# Patient Record
Sex: Male | Born: 2000
Health system: Southern US, Community
[De-identification: ages and names within clinical notes are randomized; demographics above are authoritative.]

## PROBLEM LIST (undated history)

## (undated) DIAGNOSIS — K611 Rectal abscess: Secondary | ICD-10-CM

## (undated) DIAGNOSIS — Z8489 Family history of other specified conditions: Secondary | ICD-10-CM

## (undated) HISTORY — PX: DENTAL RESTORATION/EXTRACTION WITH X-RAY: SHX5796

## (undated) HISTORY — PX: MOUTH SURGERY: SHX715

---
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
## 2021-01-15 ENCOUNTER — Ambulatory Visit: Payer: Self-pay | Admitting: General Surgery

## 2021-01-15 NOTE — H&P (View-Only) (Signed)
   The patient is a 20 year old male who presents for wound check. Hx of incision and drainage of perirectal abscess, debridement of 6 x 3 x 1 cm necrotic skin and subcutaneous tissue on 06/24/19 by Dr. Dwain Sarna. He was seen in the ER on 07/09/20 at which time a recurrent abscess had spontaneously started draining foul-smelling fluid. He was discharged with Cipro/Flagyl . He then developed recurrent perianal pain in the same location several weeks ago and was given a course of antibiotics for this. He is still having some pain in the area.   Problem List/Past Medical Romie Levee, MD; 01/15/2021 11:40 AM) WOUND CHECK, ABSCESS (Z51.89) ABSCESS, PERIRECTAL (K61.1)  Past Surgical History Romie Levee, MD; 01/15/2021 11:40 AM) Oral Surgery  Diagnostic Studies History Romie Levee, MD; 01/15/2021 11:40 AM) Colonoscopy never  Allergies Michel Bickers, LPN; 01/05/8118 14:78 AM) Penicillin V *PENICILLINS* No Known Drug Allergies [06/24/2019]: (Marked as Inactive) Allergies Reconciled  Medication History Michel Bickers, LPN; 2/95/6213 08:65 AM) No Current Medications Medications Reconciled  Social History Romie Levee, MD; 01/15/2021 11:40 AM) Caffeine use Carbonated beverages. No alcohol use No drug use  Family History Romie Levee, MD; 01/15/2021 11:40 AM) Diabetes Mellitus Mother. Hypertension Father.  Other Problems Romie Levee, MD; 01/15/2021 11:40 AM) No pertinent past medical history     Physical Exam Romie Levee MD; 01/15/2021 11:37 AM)  The physical exam findings are as follows: Note:GENERAL: Well-developed, well nourished male in no acute distress  EYES: No scleral icterus Pupils equal, lids normal  RESPIRATORY: Normal effort, no use of accessory muscles  MUSCULOSKELETAL: Normal gait Grossly normal ROM upper extremities Grossly normal ROM lower extremities  SKIN: Warm and dry Not diaphoretic  PSYCHIATRIC: Normal judgement and  insight Normal mood and affect Alert, oriented x 3  Rectal Note: Left anterior perianal region: Scar is present and widened with a moderate amount of swelling, but no overlying erythema/cellulitis Some possible fluctuance left lateral area No active drainage tender to palpation    Assessment & Plan Romie Levee MD; 01/15/2021 11:36 AM)  ABSCESS, PERIRECTAL (K61.1) Impression: 20 year old male with recurrent perirectal abscesses to the left anterior perineal region. On exam today, he appears to have a small area of fluctuance in the left lateral perianal region as well. This is tender to palpation. I do not think it needs drainage at this time. I have recommended an exam under anesthesia with fistulotomy versus seton. We have discussed this in detail including the possible risk of incontinence if too much of his sphincter muscle is transected. We discussed performing a seton. If this is the case, and proceeding with a second surgery several months down the road.

## 2021-01-15 NOTE — H&P (Signed)
   The patient is a 19 year old male who presents for wound check. Hx of incision and drainage of perirectal abscess, debridement of 6 x 3 x 1 cm necrotic skin and subcutaneous tissue on 06/24/19 by Dr. Wakefield. He was seen in the ER on 07/09/20 at which time a recurrent abscess had spontaneously started draining foul-smelling fluid. He was discharged with Cipro/Flagyl . He then developed recurrent perianal pain in the same location several weeks ago and was given a course of antibiotics for this. He is still having some pain in the area.   Problem List/Past Medical (Bita Cartwright, MD; 01/15/2021 11:40 AM) WOUND CHECK, ABSCESS (Z51.89) ABSCESS, PERIRECTAL (K61.1)  Past Surgical History (Jazlin Tapscott, MD; 01/15/2021 11:40 AM) Oral Surgery  Diagnostic Studies History (Jaimie Redditt, MD; 01/15/2021 11:40 AM) Colonoscopy never  Allergies (Kelly Dockery, LPN; 01/15/2021 11:27 AM) Penicillin V *PENICILLINS* No Known Drug Allergies [06/24/2019]: (Marked as Inactive) Allergies Reconciled  Medication History (Kelly Dockery, LPN; 01/15/2021 11:27 AM) No Current Medications Medications Reconciled  Social History (Lennon Boutwell, MD; 01/15/2021 11:40 AM) Caffeine use Carbonated beverages. No alcohol use No drug use  Family History (Izola Teague, MD; 01/15/2021 11:40 AM) Diabetes Mellitus Mother. Hypertension Father.  Other Problems (Shanique Aslinger, MD; 01/15/2021 11:40 AM) No pertinent past medical history     Physical Exam (Paulena Servais MD; 01/15/2021 11:37 AM)  The physical exam findings are as follows: Note:GENERAL: Well-developed, well nourished male in no acute distress  EYES: No scleral icterus Pupils equal, lids normal  RESPIRATORY: Normal effort, no use of accessory muscles  MUSCULOSKELETAL: Normal gait Grossly normal ROM upper extremities Grossly normal ROM lower extremities  SKIN: Warm and dry Not diaphoretic  PSYCHIATRIC: Normal judgement and  insight Normal mood and affect Alert, oriented x 3  Rectal Note: Left anterior perianal region: Scar is present and widened with a moderate amount of swelling, but no overlying erythema/cellulitis Some possible fluctuance left lateral area No active drainage tender to palpation    Assessment & Plan (Lira Stephen MD; 01/15/2021 11:36 AM)  ABSCESS, PERIRECTAL (K61.1) Impression: 19-year-old male with recurrent perirectal abscesses to the left anterior perineal region. On exam today, he appears to have a small area of fluctuance in the left lateral perianal region as well. This is tender to palpation. I do not think it needs drainage at this time. I have recommended an exam under anesthesia with fistulotomy versus seton. We have discussed this in detail including the possible risk of incontinence if too much of his sphincter muscle is transected. We discussed performing a seton. If this is the case, and proceeding with a second surgery several months down the road. 

## 2021-01-22 ENCOUNTER — Encounter (HOSPITAL_BASED_OUTPATIENT_CLINIC_OR_DEPARTMENT_OTHER): Payer: Self-pay | Admitting: General Surgery

## 2021-01-22 ENCOUNTER — Other Ambulatory Visit (HOSPITAL_COMMUNITY)
Admission: RE | Admit: 2021-01-22 | Discharge: 2021-01-22 | Disposition: A | Discharge status: Home / Self Care | Payer: Medicaid Other | Source: Ambulatory Visit | Attending: General Surgery | Admitting: General Surgery

## 2021-01-22 DIAGNOSIS — Z20822 Contact with and (suspected) exposure to covid-19: Secondary | ICD-10-CM | POA: Insufficient documentation

## 2021-01-22 DIAGNOSIS — Z01812 Encounter for preprocedural laboratory examination: Secondary | ICD-10-CM | POA: Insufficient documentation

## 2021-01-22 LAB — SARS CORONAVIRUS 2 (TAT 6-24 HRS): SARS Coronavirus 2: NEGATIVE

## 2021-01-23 ENCOUNTER — Other Ambulatory Visit: Payer: Self-pay

## 2021-01-23 ENCOUNTER — Encounter (HOSPITAL_BASED_OUTPATIENT_CLINIC_OR_DEPARTMENT_OTHER): Payer: Self-pay | Admitting: General Surgery

## 2021-01-23 NOTE — Progress Notes (Signed)
Spoke w/ via phone for pre-op interview---  Pt's mother, Thalia Party Lab needs dos---- no              Lab results------ no COVID test ------ done 01-22-2021 negative result in epic Arrive at ------- 0745 on 01-25-2021 NPO after MN NO Solid Food.  Clear liquids from MN until--- 0645 Med rec completed Medications to take morning of surgery ----- NONE Diabetic medication ----- n/a Patient instructed to bring photo id and insurance card day of surgery Patient aware to have Driver (ride ) / caregiver    for 24 hours after surgery --- mother Patient Special Instructions ----- n/a Pre-Op special Istructions ----- n/a Patient verbalized understanding of instructions that were given at this phone interview. Patient denies shortness of breath, chest pain, fever, cough at this phone interview.

## 2021-01-25 ENCOUNTER — Ambulatory Visit (HOSPITAL_BASED_OUTPATIENT_CLINIC_OR_DEPARTMENT_OTHER): Payer: Medicaid Other | Admitting: Anesthesiology

## 2021-01-25 ENCOUNTER — Encounter (HOSPITAL_BASED_OUTPATIENT_CLINIC_OR_DEPARTMENT_OTHER): Payer: Self-pay | Admitting: General Surgery

## 2021-01-25 ENCOUNTER — Ambulatory Visit (HOSPITAL_BASED_OUTPATIENT_CLINIC_OR_DEPARTMENT_OTHER)
Admission: RE | Admit: 2021-01-25 | Discharge: 2021-01-25 | Disposition: A | Discharge status: Home / Self Care | Payer: Medicaid Other | Source: Ambulatory Visit | Attending: General Surgery | Admitting: General Surgery

## 2021-01-25 ENCOUNTER — Encounter (HOSPITAL_BASED_OUTPATIENT_CLINIC_OR_DEPARTMENT_OTHER)
Admission: RE | Disposition: A | Discharge status: Home / Self Care | Payer: Self-pay | Source: Ambulatory Visit | Attending: General Surgery

## 2021-01-25 DIAGNOSIS — K611 Rectal abscess: Secondary | ICD-10-CM | POA: Diagnosis present

## 2021-01-25 DIAGNOSIS — Z880 Allergy status to penicillin: Secondary | ICD-10-CM | POA: Diagnosis not present

## 2021-01-25 DIAGNOSIS — K603 Anal fistula: Secondary | ICD-10-CM | POA: Diagnosis not present

## 2021-01-25 HISTORY — PX: RECTAL EXAM UNDER ANESTHESIA: SHX6399

## 2021-01-25 HISTORY — DX: Rectal abscess: K61.1

## 2021-01-25 HISTORY — PX: ANAL FISTULOTOMY: SHX6423

## 2021-01-25 SURGERY — ANAL FISTULOTOMY
Anesthesia: Monitor Anesthesia Care

## 2021-01-25 MED ORDER — ACETAMINOPHEN 160 MG/5ML PO SOLN: 325.0000 mg | ORAL | Status: DC | PRN

## 2021-01-25 MED ORDER — BUPIVACAINE-EPINEPHRINE 0.5% -1:200000 IJ SOLN
INTRAMUSCULAR | Status: DC | PRN
  Administered 2021-01-25: 50 mL

## 2021-01-25 MED ORDER — PROPOFOL 500 MG/50ML IV EMUL
INTRAVENOUS | Status: AC
  Filled 2021-01-25: qty 50

## 2021-01-25 MED ORDER — LACTATED RINGERS IV SOLN: INTRAVENOUS | Status: DC

## 2021-01-25 MED ORDER — ONDANSETRON HCL 4 MG/2ML IJ SOLN
INTRAMUSCULAR | Status: DC | PRN
  Administered 2021-01-25: 4 mg via INTRAVENOUS

## 2021-01-25 MED ORDER — FENTANYL CITRATE (PF) 100 MCG/2ML IJ SOLN
25.0000 ug | INTRAMUSCULAR | Status: DC | PRN
  Administered 2021-01-25: 25 ug via INTRAVENOUS

## 2021-01-25 MED ORDER — ONDANSETRON HCL 4 MG/2ML IJ SOLN
INTRAMUSCULAR | Status: AC
  Filled 2021-01-25: qty 2

## 2021-01-25 MED ORDER — ACETAMINOPHEN 10 MG/ML IV SOLN: 1000.0000 mg | Freq: Once | INTRAVENOUS | Status: DC | PRN

## 2021-01-25 MED ORDER — OXYCODONE HCL 5 MG PO TABS: 5.0000 mg | ORAL_TABLET | Freq: Four times a day (QID) | ORAL | 0 refills | Status: DC | PRN

## 2021-01-25 MED ORDER — FENTANYL CITRATE (PF) 100 MCG/2ML IJ SOLN
INTRAMUSCULAR | Status: DC | PRN
  Administered 2021-01-25: 25 ug via INTRAVENOUS
  Administered 2021-01-25: 50 ug via INTRAVENOUS

## 2021-01-25 MED ORDER — AMISULPRIDE (ANTIEMETIC) 5 MG/2ML IV SOLN: 10.0000 mg | Freq: Once | INTRAVENOUS | Status: DC | PRN

## 2021-01-25 MED ORDER — ACETAMINOPHEN 500 MG PO TABS
ORAL_TABLET | ORAL | Status: AC
  Filled 2021-01-25: qty 2

## 2021-01-25 MED ORDER — KETAMINE HCL 50 MG/5ML IJ SOSY
PREFILLED_SYRINGE | INTRAMUSCULAR | Status: AC
  Filled 2021-01-25: qty 5

## 2021-01-25 MED ORDER — OXYCODONE HCL 5 MG/5ML PO SOLN: 5.0000 mg | Freq: Once | ORAL | Status: DC | PRN

## 2021-01-25 MED ORDER — PROPOFOL 500 MG/50ML IV EMUL
INTRAVENOUS | Status: DC | PRN
  Administered 2021-01-25: 200 ug/kg/min via INTRAVENOUS

## 2021-01-25 MED ORDER — PROMETHAZINE HCL 25 MG/ML IJ SOLN: 6.2500 mg | INTRAMUSCULAR | Status: DC | PRN

## 2021-01-25 MED ORDER — MIDAZOLAM HCL 5 MG/5ML IJ SOLN
INTRAMUSCULAR | Status: DC | PRN
  Administered 2021-01-25: 2 mg via INTRAVENOUS

## 2021-01-25 MED ORDER — DEXMEDETOMIDINE (PRECEDEX) IN NS 20 MCG/5ML (4 MCG/ML) IV SYRINGE
PREFILLED_SYRINGE | INTRAVENOUS | Status: AC
  Filled 2021-01-25: qty 5

## 2021-01-25 MED ORDER — FENTANYL CITRATE (PF) 100 MCG/2ML IJ SOLN
INTRAMUSCULAR | Status: AC
  Filled 2021-01-25: qty 2

## 2021-01-25 MED ORDER — KETAMINE HCL 10 MG/ML IJ SOLN
INTRAMUSCULAR | Status: DC | PRN
  Administered 2021-01-25: 20 mg via INTRAVENOUS

## 2021-01-25 MED ORDER — GLYCOPYRROLATE PF 0.2 MG/ML IJ SOSY
PREFILLED_SYRINGE | INTRAMUSCULAR | Status: AC
  Filled 2021-01-25: qty 1

## 2021-01-25 MED ORDER — DEXMEDETOMIDINE (PRECEDEX) IN NS 20 MCG/5ML (4 MCG/ML) IV SYRINGE
PREFILLED_SYRINGE | INTRAVENOUS | Status: DC | PRN
  Administered 2021-01-25 (×2): 8 ug via INTRAVENOUS
  Administered 2021-01-25: 4 ug via INTRAVENOUS

## 2021-01-25 MED ORDER — ACETAMINOPHEN 500 MG PO TABS
1000.0000 mg | ORAL_TABLET | ORAL | Status: AC
  Administered 2021-01-25: 1000 mg via ORAL

## 2021-01-25 MED ORDER — LIDOCAINE 5 % EX OINT
TOPICAL_OINTMENT | CUTANEOUS | Status: DC | PRN
  Administered 2021-01-25: 1

## 2021-01-25 MED ORDER — 0.9 % SODIUM CHLORIDE (POUR BTL) OPTIME
TOPICAL | Status: DC | PRN
  Administered 2021-01-25: 1000 mL

## 2021-01-25 MED ORDER — SODIUM CHLORIDE 0.9% FLUSH: 3.0000 mL | Freq: Two times a day (BID) | INTRAVENOUS | Status: DC

## 2021-01-25 MED ORDER — GLYCOPYRROLATE PF 0.2 MG/ML IJ SOSY
PREFILLED_SYRINGE | INTRAMUSCULAR | Status: DC | PRN
  Administered 2021-01-25: .1 mg via INTRAVENOUS

## 2021-01-25 MED ORDER — OXYCODONE HCL 5 MG PO TABS: 5.0000 mg | ORAL_TABLET | Freq: Once | ORAL | Status: DC | PRN

## 2021-01-25 MED ORDER — MIDAZOLAM HCL 2 MG/2ML IJ SOLN
INTRAMUSCULAR | Status: AC
  Filled 2021-01-25: qty 2

## 2021-01-25 MED ORDER — PROPOFOL 10 MG/ML IV BOLUS
INTRAVENOUS | Status: DC | PRN
  Administered 2021-01-25: 30 mg via INTRAVENOUS

## 2021-01-25 MED ORDER — ACETAMINOPHEN 325 MG PO TABS: 325.0000 mg | ORAL_TABLET | ORAL | Status: DC | PRN

## 2021-01-25 SURGICAL SUPPLY — 55 items
APL SKNCLS STERI-STRIP NONHPOA (GAUZE/BANDAGES/DRESSINGS) ×2
BENZOIN TINCTURE PRP APPL 2/3 (GAUZE/BANDAGES/DRESSINGS) ×4 IMPLANT
BLADE EXTENDED COATED 6.5IN (ELECTRODE) IMPLANT
BLADE HEX COATED 2.75 (ELECTRODE) ×2 IMPLANT
BLADE SURG 10 STRL SS (BLADE) ×2 IMPLANT
BRIEF STRETCH FOR OB PAD LRG (UNDERPADS AND DIAPERS) ×2 IMPLANT
CANISTER SUCT 3000ML PPV (MISCELLANEOUS) ×2 IMPLANT
COVER BACK TABLE 60X90IN (DRAPES) ×2 IMPLANT
COVER MAYO STAND STRL (DRAPES) ×2 IMPLANT
COVER WAND RF STERILE (DRAPES) ×2 IMPLANT
DECANTER SPIKE VIAL GLASS SM (MISCELLANEOUS) ×2 IMPLANT
DRAPE HYSTEROSCOPY (MISCELLANEOUS) IMPLANT
DRAPE LAPAROTOMY 100X72 PEDS (DRAPES) ×2 IMPLANT
DRAPE SHEET LG 3/4 BI-LAMINATE (DRAPES) IMPLANT
DRAPE UTILITY XL STRL (DRAPES) ×2 IMPLANT
DRSG PAD ABDOMINAL 8X10 ST (GAUZE/BANDAGES/DRESSINGS) ×2 IMPLANT
ELECT REM PT RETURN 9FT ADLT (ELECTROSURGICAL) ×2
ELECTRODE REM PT RTRN 9FT ADLT (ELECTROSURGICAL) ×1 IMPLANT
GAUZE SPONGE 4X4 12PLY STRL (GAUZE/BANDAGES/DRESSINGS) ×2 IMPLANT
GLOVE SURG ENC MOIS LTX SZ6.5 (GLOVE) ×2 IMPLANT
GLOVE SURG UNDER POLY LF SZ7 (GLOVE) ×2 IMPLANT
GOWN STRL REUS W/TWL XL LVL3 (GOWN DISPOSABLE) ×2 IMPLANT
HYDROGEN PEROXIDE 16OZ (MISCELLANEOUS) ×2 IMPLANT
IV CATH 14GX2 1/4 (CATHETERS) ×2 IMPLANT
IV CATH 18G SAFETY (IV SOLUTION) ×2 IMPLANT
KIT SIGMOIDOSCOPE (SET/KITS/TRAYS/PACK) IMPLANT
KIT TURNOVER CYSTO (KITS) ×2 IMPLANT
LEGGING LITHOTOMY PAIR STRL (DRAPES) IMPLANT
LOOP VESSEL MAXI BLUE (MISCELLANEOUS) IMPLANT
NEEDLE HYPO 22GX1.5 SAFETY (NEEDLE) ×2 IMPLANT
NS IRRIG 500ML POUR BTL (IV SOLUTION) ×2 IMPLANT
PACK BASIN DAY SURGERY FS (CUSTOM PROCEDURE TRAY) ×2 IMPLANT
PAD ARMBOARD 7.5X6 YLW CONV (MISCELLANEOUS) ×2 IMPLANT
PAD PREP 24X48 CUFFED NSTRL (MISCELLANEOUS) IMPLANT
PENCIL SMOKE EVACUATOR (MISCELLANEOUS) ×2 IMPLANT
SPONGE HEMORRHOID 8X3CM (HEMOSTASIS) IMPLANT
SPONGE SURGIFOAM ABS GEL 100 (HEMOSTASIS) IMPLANT
SPONGE SURGIFOAM ABS GEL 12-7 (HEMOSTASIS) IMPLANT
SUCTION FRAZIER HANDLE 10FR (MISCELLANEOUS)
SUCTION TUBE FRAZIER 10FR DISP (MISCELLANEOUS) IMPLANT
SUT CHROMIC 2 0 SH (SUTURE) ×2 IMPLANT
SUT CHROMIC 3 0 SH 27 (SUTURE) IMPLANT
SUT ETHIBOND 0 (SUTURE) IMPLANT
SUT VIC AB 2-0 SH 27 (SUTURE)
SUT VIC AB 2-0 SH 27XBRD (SUTURE) IMPLANT
SUT VIC AB 3-0 SH 18 (SUTURE) ×2 IMPLANT
SUT VIC AB 3-0 SH 27 (SUTURE)
SUT VIC AB 3-0 SH 27XBRD (SUTURE) IMPLANT
SYR 10ML LL (SYRINGE) IMPLANT
SYR CONTROL 10ML LL (SYRINGE) ×2 IMPLANT
TOWEL OR 17X26 10 PK STRL BLUE (TOWEL DISPOSABLE) ×2 IMPLANT
TRAY DSU PREP LF (CUSTOM PROCEDURE TRAY) ×2 IMPLANT
TUBE CONNECTING 12X1/4 (SUCTIONS) ×2 IMPLANT
UNDERPAD 30X36 HEAVY ABSORB (UNDERPADS AND DIAPERS) ×2 IMPLANT
YANKAUER SUCT BULB TIP NO VENT (SUCTIONS) ×2 IMPLANT

## 2021-01-25 NOTE — Discharge Instructions (Addendum)
ANORECTAL SURGERY: POST OP INSTRUCTIONS 1. Take your usually prescribed home medications unless otherwise directed. 2. DIET: During the first few hours after surgery sip on some liquids until you are able to urinate.  It is normal to not urinate for several hours after this surgery.  If you feel uncomfortable, please contact the office for instructions.  After you are able to urinate,you may eat, if you feel like it.  Follow a light bland diet the first 24 hours after arrival home, such as soup, liquids, crackers, etc.  Be sure to include lots of fluids daily (6-8 glasses).  Avoid fast food or heavy meals, as your are more likely to get nauseated.  Eat a low fat diet the next few days after surgery.  Limit caffeine intake to 1-2 servings a day. 3. PAIN CONTROL: a. Pain is best controlled by a usual combination of several different methods TOGETHER: i. Muscle relaxation: Soak in a warm bath (or Sitz bath) three times a day and after bowel movements.  Continue to do this until all pain is resolved. ii. Over the counter pain medication iii. Prescription pain medication b. Most patients will experience some swelling and discomfort in the anus/rectal area and incisions.  Heat such as warm towels, sitz baths, warm baths, etc to help relax tight/sore spots and speed recovery.  Some people prefer to use ice, especially in the first couple days after surgery, as it may decrease the pain and swelling, or alternate between ice & heat.  Experiment to what works for you.  Swelling and bruising can take several weeks to resolve.  Pain can take even longer to completely resolve. c. It is helpful to take an over-the-counter pain medication regularly for the first few weeks.  Choose one of the following that works best for you: i. Naproxen (Aleve, etc)  Two 220mg tabs twice a day ii. Ibuprofen (Advil, etc) Three 200mg tabs four times a day (every meal & bedtime) d. A  prescription for pain medication (such as percocet,  oxycodone, hydrocodone, etc) should be given to you upon discharge.  Take your pain medication as prescribed.  i. If you are having problems/concerns with the prescription medicine (does not control pain, nausea, vomiting, rash, itching, etc), please call us [phone] to see if we need to switch you to a different pain medicine that will work better for you and/or control your side effect better. ii. If you need a refill on your pain medication, please contact your pharmacy.  They will contact our office to request authorization. Prescriptions will not be filled after 5 pm or on week-ends. 4. KEEP YOUR BOWELS REGULAR and AVOID CONSTIPATION a. The goal is one to two soft bowel movements a day.  You should at least have a bowel movement every other day. b. Avoid getting constipated.  Between the surgery and the pain medications, it is common to experience some constipation. This can be very painful after rectal surgery.  Increasing fluid intake and taking a fiber supplement (such as Metamucil, Citrucel, FiberCon, etc) 1-2 times a day regularly will usually help prevent this problem from occurring.  A stool softener like colace is also recommended.  This can be purchased over the counter at your pharmacy.  You can take it up to 3 times a day.  If you do not have a bowel movement after 24 hrs since your surgery, take one does of milk of magnesia.  If you still haven't had a bowel movement 8-12 hours after   that dose, take another dose.  If you don't have a bowel movement 48 hrs after surgery, purchase a Fleets enema from the drug store and administer gently per package instructions.  If you still are having trouble with your bowel movements after that, please call the office for further instructions. c. If you develop diarrhea or have many loose bowel movements, simplify your diet to bland foods & liquids for a few days.  Stop any stool softeners and decrease your fiber supplement.  Switching to mild  anti-diarrheal medications (Kayopectate, Pepto Bismol) can help.  If this worsens or does not improve, please call us.  5. Wound Care a. Remove your bandages before your first bowel movement or 8 hours after surgery.     b. Remove any wound packing material at this tim,e as well.  You do not need to repack the wound unless instructed otherwise.  Wear an absorbent pad or soft cotton gauze in your underwear to catch any drainage and help keep the area clean. You should change this every 2-3 hours while awake. c. Keep the area clean and dry.  Bathe / shower every day, especially after bowel movements.  Keep the area clean by showering / bathing over the incision / wound.   It is okay to soak an open wound to help wash it.  Wet wipes or showers / gentle washing after bowel movements is often less traumatic than regular toilet paper. d. You may have some styrofoam-like soft packing in the rectum which will come out with the first bowel movement.  e. You will often notice bleeding with bowel movements.  This should slow down by the end of the first week of surgery f. Expect some drainage.  This should slow down, too, by the end of the first week of surgery.  Wear an absorbent pad or soft cotton gauze in your underwear until the drainage stops. g. Do Not sit on a rubber or pillow ring.  This can make you symptoms worse.  You may sit on a soft pillow if needed.  6. ACTIVITIES as tolerated:   a. You may resume regular (light) daily activities beginning the next day--such as daily self-care, walking, climbing stairs--gradually increasing activities as tolerated.  If you can walk 30 minutes without difficulty, it is safe to try more intense activity such as jogging, treadmill, bicycling, low-impact aerobics, swimming, etc. b. Save the most intensive and strenuous activity for last such as sit-ups, heavy lifting, contact sports, etc  Refrain from any heavy lifting or straining until you are off narcotics for pain  control.   c. You may drive when you are no longer taking prescription pain medication, you can comfortably sit for long periods of time, and you can safely maneuver your car and apply brakes. d. You may have sexual intercourse when it is comfortable.  7. FOLLOW UP in our office a. Please call [facility] at [phone] to set up an appointment to see your surgeon in the office for a follow-up appointment approximately 3-4 weeks after your surgery. b. Make sure that you call for this appointment the day you arrive home to insure a convenient appointment time. 10. IF YOU HAVE DISABILITY OR FAMILY LEAVE FORMS, BRING THEM TO THE OFFICE FOR PROCESSING.  DO NOT GIVE THEM TO YOUR DOCTOR.     WHEN TO CALL US [phone]: 1. Poor pain control 2. Reactions / problems with new medications (rash/itching, nausea, etc)  3. Fever over 101.5 F (38.5 C) 4.   Inability to urinate 5. Nausea and/or vomiting 6. Worsening swelling or bruising 7. Continued bleeding from incision. 8. Increased pain, redness, or drainage from the incision  The clinic staff is available to answer your questions during regular business hours (8:30am-5pm).  Please don't hesitate to call and ask to speak to one of our nurses for clinical concerns.   A surgeon from [facility] is always on call at the hospitals   If you have a medical emergency, go to the nearest emergency room or call 911.    [facility] [address], [address], [place], [place]  [address] ? MAIN: (336) [phone] ? TOLL FREE: [phone] ? FAX [phone] [email or web address]   Post Anesthesia Home Care Instructions  Activity: Get plenty of rest for the remainder of the day. A responsible individual must stay with you for 24 hours following the procedure.  For the next 24 hours, DO NOT: -Drive a car -Advertising copywriter -Drink alcoholic beverages -Take any medication unless instructed by your physician -Make  any legal decisions or sign important papers.  Meals: Start with liquid foods such as gelatin or soup. Progress to regular foods as tolerated. Avoid greasy, spicy, heavy foods. If nausea and/or vomiting occur, drink only clear liquids until the nausea and/or vomiting subsides. Call your physician if vomiting continues.  Special Instructions/Symptoms: Your throat may feel dry or sore from the anesthesia or the breathing tube placed in your throat during surgery. If this causes discomfort, gargle with warm salt water. The discomfort should disappear within 24 hours.  No Tylenol/acetaminophen until after 2:00 pm today if needed.

## 2021-01-25 NOTE — Transfer of Care (Signed)
Immediate Anesthesia Transfer of Care Note  Patient: Shawn Holmes  Procedure(s) Performed: FISTULOTOMY (N/A ) EXAM UNDER ANESTHESIA (N/A )  Patient Location: PACU  Anesthesia Type:MAC  Level of Consciousness: drowsy, patient cooperative and responds to stimulation  Airway & Oxygen Therapy: Patient Spontanous Breathing and Patient connected to face mask oxygen  Post-op Assessment: Report given to RN and Post -op Vital signs reviewed and stable  Post vital signs: Reviewed and stable  Last Vitals:  Vitals Value Taken Time  BP 117/59 01/25/21 0935  Temp    Pulse    Resp 0 01/25/21 0937  SpO2    Vitals shown include unvalidated device data.  Last Pain:  Vitals:   01/25/21 0805  TempSrc: Oral  PainSc: 0-No pain      Patients Stated Pain Goal: 5 (58/25/18 9842)  Complications: No complications documented.

## 2021-01-25 NOTE — Anesthesia Preprocedure Evaluation (Addendum)
Anesthesia Evaluation  Patient identified by MRN, date of birth, ID band Patient awake    Reviewed: Allergy & Precautions, NPO status , Patient's Chart, lab work & pertinent test results  Airway Mallampati: III  TM Distance: >3 FB Neck ROM: Full    Dental  (+) Teeth Intact, Dental Advisory Given   Pulmonary neg pulmonary ROS,    breath sounds clear to auscultation       Cardiovascular negative cardio ROS   Rhythm:Regular Rate:Normal     Neuro/Psych negative neurological ROS  negative psych ROS   GI/Hepatic negative GI ROS, Neg liver ROS,   Endo/Other  negative endocrine ROS  Renal/GU negative Renal ROS     Musculoskeletal negative musculoskeletal ROS (+)   Abdominal Normal abdominal exam  (+)   Peds  Hematology negative hematology ROS (+)   Anesthesia Other Findings   Reproductive/Obstetrics                            Anesthesia Physical Anesthesia Plan  ASA: I  Anesthesia Plan: MAC   Post-op Pain Management:    Induction: Intravenous  PONV Risk Score and Plan: 2 and Ondansetron, Propofol infusion and Midazolam  Airway Management Planned: Simple Face Mask and Natural Airway  Additional Equipment: None  Intra-op Plan:   Post-operative Plan:   Informed Consent: I have reviewed the patients History and Physical, chart, labs and discussed the procedure including the risks, benefits and alternatives for the proposed anesthesia with the patient or authorized representative who has indicated his/her understanding and acceptance.       Plan Discussed with: CRNA  Anesthesia Plan Comments:       Anesthesia Quick Evaluation

## 2021-01-25 NOTE — Interval H&P Note (Signed)
History and Physical Interval Note:  01/25/2021 8:30 AM  Shawn Holmes  has presented today for surgery, with the diagnosis of RECURRENT PERIRECTAL ABSCESS.  The various methods of treatment have been discussed with the patient and family. After consideration of risks, benefits and other options for treatment, the patient has consented to  Procedure(s): FISTULOTOMY VS SETON (N/A) EXAM UNDER ANESTHESIA (N/A) POSSIBLE INCISION AND DRAINAGE ABSCESS (N/A) as a surgical intervention.  The patient's history has been reviewed, patient examined, no change in status, stable for surgery.  I have reviewed the patient's chart and labs.  Questions were answered to the patient's satisfaction.     Vanita Panda, MD  Colorectal and General Surgery [facility]
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
## 2021-01-25 NOTE — Anesthesia Postprocedure Evaluation (Signed)
Anesthesia Post Note  Patient: Shawn Holmes  Procedure(s) Performed: FISTULOTOMY (N/A ) EXAM UNDER ANESTHESIA (N/A )     Patient location during evaluation: PACU Anesthesia Type: MAC Level of consciousness: awake and alert Pain management: pain level controlled Vital Signs Assessment: post-procedure vital signs reviewed and stable Respiratory status: spontaneous breathing, nonlabored ventilation, respiratory function stable and patient connected to nasal cannula oxygen Cardiovascular status: stable and blood pressure returned to baseline Postop Assessment: no apparent nausea or vomiting Anesthetic complications: no   No complications documented.  Last Vitals:  Vitals:   01/25/21 1000 01/25/21 1041  BP: (!) 111/50 (!) 110/49  Pulse: (!) 53 (!) 45  Resp: 11 14  Temp: 36.5 C 36.6 C  SpO2: 99% 100%    Last Pain:  Vitals:   01/25/21 1041  TempSrc:   PainSc: 0-No pain                 Effie Berkshire

## 2021-01-28 ENCOUNTER — Encounter (HOSPITAL_BASED_OUTPATIENT_CLINIC_OR_DEPARTMENT_OTHER): Payer: Self-pay | Admitting: General Surgery

## 2021-03-18 ENCOUNTER — Emergency Department (HOSPITAL_BASED_OUTPATIENT_CLINIC_OR_DEPARTMENT_OTHER)
Admission: EM | Admit: 2021-03-18 | Discharge: 2021-03-18 | Disposition: A | Discharge status: Home / Self Care | Payer: Medicaid Other | Attending: Emergency Medicine | Admitting: Emergency Medicine

## 2021-03-18 ENCOUNTER — Other Ambulatory Visit: Payer: Self-pay

## 2021-03-18 ENCOUNTER — Encounter (HOSPITAL_BASED_OUTPATIENT_CLINIC_OR_DEPARTMENT_OTHER): Payer: Self-pay | Admitting: *Deleted

## 2021-03-18 DIAGNOSIS — Y92410 Unspecified street and highway as the place of occurrence of the external cause: Secondary | ICD-10-CM | POA: Diagnosis not present

## 2021-03-18 DIAGNOSIS — M545 Low back pain, unspecified: Secondary | ICD-10-CM | POA: Insufficient documentation
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
## 2021-03-18 NOTE — Discharge Instructions (Addendum)
Ibuprofen for soreness  
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
## 2022-02-11 ENCOUNTER — Other Ambulatory Visit: Payer: Self-pay | Admitting: General Surgery

## 2022-02-11 DIAGNOSIS — K603 Anal fistula: Secondary | ICD-10-CM

## 2023-10-18 ENCOUNTER — Emergency Department (HOSPITAL_BASED_OUTPATIENT_CLINIC_OR_DEPARTMENT_OTHER): Payer: Medicaid Other

## 2023-10-18 ENCOUNTER — Other Ambulatory Visit: Payer: Self-pay

## 2023-10-18 ENCOUNTER — Encounter (HOSPITAL_BASED_OUTPATIENT_CLINIC_OR_DEPARTMENT_OTHER): Payer: Self-pay | Admitting: Emergency Medicine

## 2023-10-18 ENCOUNTER — Emergency Department (HOSPITAL_BASED_OUTPATIENT_CLINIC_OR_DEPARTMENT_OTHER)
Admission: EM | Admit: 2023-10-18 | Discharge: 2023-10-18 | Disposition: A | Discharge status: Home / Self Care | Payer: Medicaid Other | Attending: Emergency Medicine | Admitting: Emergency Medicine

## 2023-10-18 DIAGNOSIS — M79601 Pain in right arm: Secondary | ICD-10-CM | POA: Diagnosis present

## 2023-10-18 MED ORDER — HYDROCODONE-ACETAMINOPHEN 5-325 MG PO TABS
1.0000 | ORAL_TABLET | Freq: Once | ORAL | Status: AC
  Administered 2023-10-18: 1 via ORAL
  Filled 2023-10-18: qty 1

## 2023-10-18 MED ORDER — IBUPROFEN 600 MG PO TABS: 600.0000 mg | ORAL_TABLET | Freq: Four times a day (QID) | ORAL | 0 refills | Status: AC | PRN

## 2023-10-18 MED ORDER — IBUPROFEN 400 MG PO TABS: 600.0000 mg | ORAL_TABLET | Freq: Once | ORAL | Status: DC
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
## 2023-10-18 NOTE — Discharge Instructions (Addendum)
As discussed, x-rays of your right arm were normal.  Suspect you have muscular injury.  Will treat with anti-inflammatories in the outpatient setting.  Attached is number for orthopedics to follow-up with for reevaluation.  Please do not hesitate to return to emergency department for worrisome signs and symptoms we discussed become apparent.

## 2023-10-18 NOTE — ED Notes (Signed)
Patient transported to X-ray 
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
## 2023-12-13 ENCOUNTER — Emergency Department (HOSPITAL_COMMUNITY)
Admission: EM | Admit: 2023-12-13 | Discharge: 2023-12-13 | Disposition: A | Discharge status: Home / Self Care | Attending: Emergency Medicine | Admitting: Emergency Medicine

## 2023-12-13 ENCOUNTER — Emergency Department (HOSPITAL_COMMUNITY)

## 2023-12-13 ENCOUNTER — Encounter (HOSPITAL_COMMUNITY): Payer: Self-pay

## 2023-12-13 ENCOUNTER — Other Ambulatory Visit: Payer: Self-pay

## 2023-12-13 DIAGNOSIS — S51812A Laceration without foreign body of left forearm, initial encounter: Secondary | ICD-10-CM | POA: Diagnosis not present

## 2023-12-13 DIAGNOSIS — Y92410 Unspecified street and highway as the place of occurrence of the external cause: Secondary | ICD-10-CM | POA: Diagnosis not present

## 2023-12-13 DIAGNOSIS — M542 Cervicalgia: Secondary | ICD-10-CM | POA: Diagnosis present

## 2023-12-13 DIAGNOSIS — S298XXA Other specified injuries of thorax, initial encounter: Secondary | ICD-10-CM | POA: Diagnosis not present

## 2023-12-13 DIAGNOSIS — S161XXA Strain of muscle, fascia and tendon at neck level, initial encounter: Secondary | ICD-10-CM | POA: Diagnosis not present

## 2023-12-13 LAB — CBC
HCT: 44.1 % (ref 39.0–52.0)
Hemoglobin: 15.5 g/dL (ref 13.0–17.0)
MCH: 32.8 pg (ref 26.0–34.0)
MCHC: 35.1 g/dL (ref 30.0–36.0)
MCV: 93.2 fL (ref 80.0–100.0)
Platelets: 354 10*3/uL (ref 150–400)
RBC: 4.73 MIL/uL (ref 4.22–5.81)
RDW: 12.6 % (ref 11.5–15.5)
WBC: 11.6 10*3/uL — ABNORMAL HIGH (ref 4.0–10.5)
nRBC: 0 % (ref 0.0–0.2)

## 2023-12-13 LAB — COMPREHENSIVE METABOLIC PANEL
ALT: 16 U/L (ref 0–44)
AST: 21 U/L (ref 15–41)
Albumin: 4 g/dL (ref 3.5–5.0)
Alkaline Phosphatase: 54 U/L (ref 38–126)
Anion gap: 12 (ref 5–15)
BUN: 9 mg/dL (ref 6–20)
CO2: 24 mmol/L (ref 22–32)
Calcium: 9.5 mg/dL (ref 8.9–10.3)
Chloride: 103 mmol/L (ref 98–111)
Creatinine, Ser: 1.24 mg/dL (ref 0.61–1.24)
GFR, Estimated: >60 mL/min (ref >60)
Glucose, Bld: 93 mg/dL (ref 70–99)
Potassium: 3.5 mmol/L (ref 3.5–5.1)
Sodium: 139 mmol/L (ref 135–145)
Total Bilirubin: 1 mg/dL (ref 0.0–1.2)
Total Protein: 7.2 g/dL (ref 6.5–8.1)

## 2023-12-13 MED ORDER — IOHEXOL 350 MG/ML SOLN
75.0000 mL | Freq: Once | INTRAVENOUS | Status: AC | PRN
Start: 2023-12-13 — End: 2023-12-13
  Administered 2023-12-13: 75 mL via INTRAVENOUS

## 2023-12-13 MED ORDER — OXYCODONE-ACETAMINOPHEN 5-325 MG PO TABS: 1.0000 | ORAL_TABLET | Freq: Three times a day (TID) | ORAL | 0 refills | Status: AC | PRN
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
